# Patient Record
Sex: Female | Born: 1945 | Hispanic: No | Smoking: Never smoker
Health system: Southern US, Community
[De-identification: ages and names within clinical notes are randomized; demographics above are authoritative.]

## PROBLEM LIST (undated history)

## (undated) DIAGNOSIS — I10 Essential (primary) hypertension: Secondary | ICD-10-CM

## (undated) DIAGNOSIS — E119 Type 2 diabetes mellitus without complications: Secondary | ICD-10-CM

---

## 2009-01-14 ENCOUNTER — Encounter: Admission: RE | Admit: 2009-01-14 | Discharge: 2009-01-14 | Payer: Self-pay | Admitting: Geriatric Medicine

## 2009-01-17 ENCOUNTER — Emergency Department (HOSPITAL_COMMUNITY): Admission: EM | Admit: 2009-01-17 | Discharge: 2009-01-17 | Payer: Self-pay | Admitting: Emergency Medicine

## 2010-05-31 LAB — DIFFERENTIAL
Lymphocytes Relative: 31 % (ref 12–46)
Lymphs Abs: 1.4 10*3/uL (ref 0.7–4.0)
Neutrophils Relative %: 58 % (ref 43–77)

## 2010-05-31 LAB — CBC
MCHC: 34 g/dL (ref 30.0–36.0)
MCV: 86 fL (ref 78.0–100.0)
RBC: 4.56 MIL/uL (ref 3.87–5.11)
RDW: 13.7 % (ref 11.5–15.5)

## 2010-05-31 LAB — COMPREHENSIVE METABOLIC PANEL
AST: 17 U/L (ref 0–37)
CO2: 26 mEq/L (ref 19–32)
Calcium: 9.8 mg/dL (ref 8.4–10.5)
Creatinine, Ser: 0.42 mg/dL (ref 0.4–1.2)
GFR calc Af Amer: >60 mL/min (ref >60)
GFR calc non Af Amer: >60 mL/min (ref >60)

## 2010-05-31 LAB — LIPASE, BLOOD: Lipase: 40 U/L (ref 11–59)

## 2011-03-18 IMAGING — CT CT PELVIS W/ CM
3 of 10 series · 12 of 46 positions shown, 18 images · IV contrast (agent unspecified)
Comparison: Abdominal ultrasound 01/14/2009.

CT ABDOMEN

CLINICAL DATA: Pancreatic tail mass on ultrasound.  Worsening
abdominal pain with nausea, vomiting and diarrhea.

CT ABDOMEN WITHOUT AND WITH CONTRAST
CT PELVIS WITH CONTRAST
TECHNIQUE: Multidetector CT imaging of the abdomen was performed
initially following the standard protocol before administration of
intravenous contrast.  Multidetector CT imaging of the abdomen and
pelvis was then performed following the standard protocol during
the bolus injection of intravenous contrast.
Contrast: 100 ml [id number]-6VV intravenously.  Oral contrast was
given.

[Series 5: abd w/o 5.0 b31f st · axial · non-contrast · 0.62mm/px · z∈[-106,-50]mm · 2 of 35 slices shown]
[im 12/35  soft-tissue]
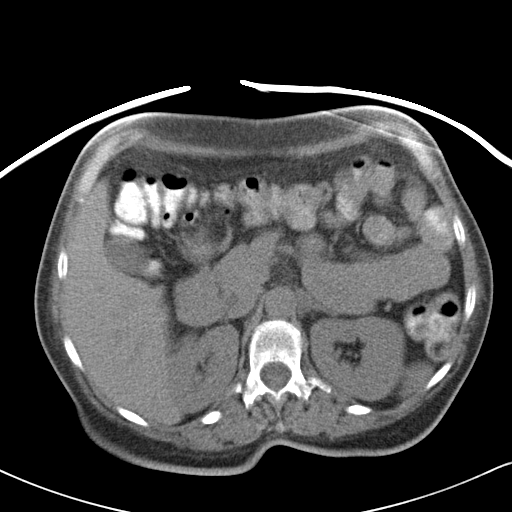
[im 23/35  soft-tissue]
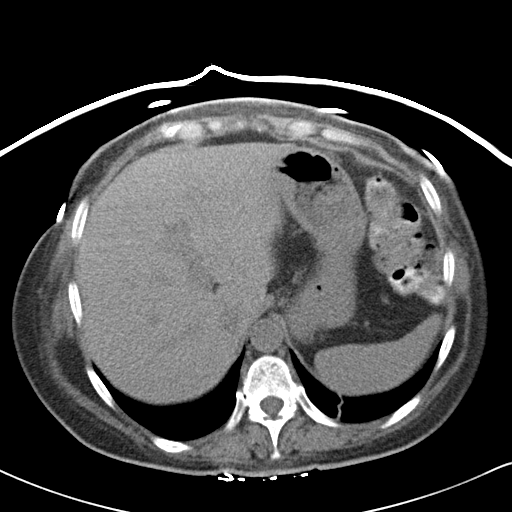

[Series 6: venous 5.0 b31f st · axial · portal-venous · 0.62mm/px · z∈[-363,-38]mm · 8 of 85 slices shown, 13 images]
[im 10/85  soft-tissue]
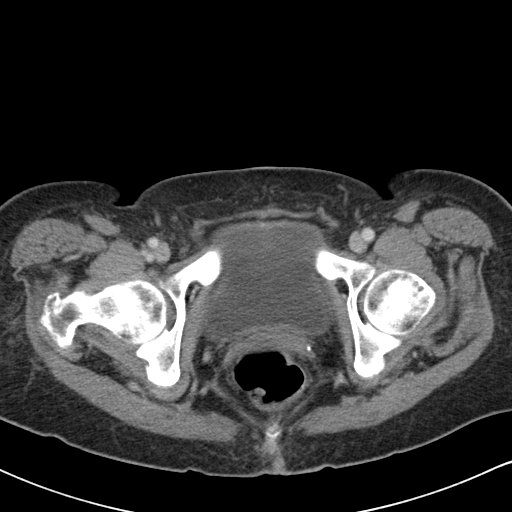
[im 10/85  bone]
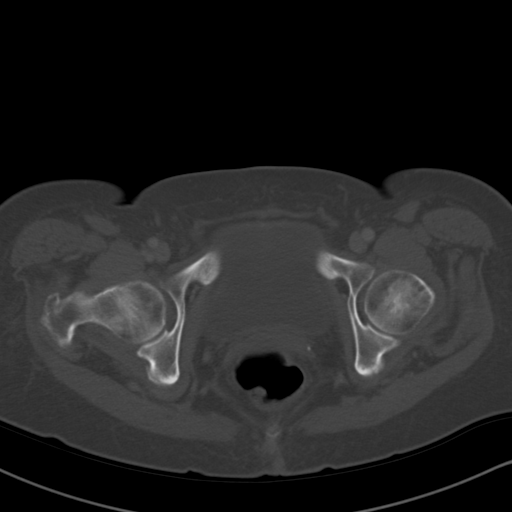
[im 19/85  soft-tissue]
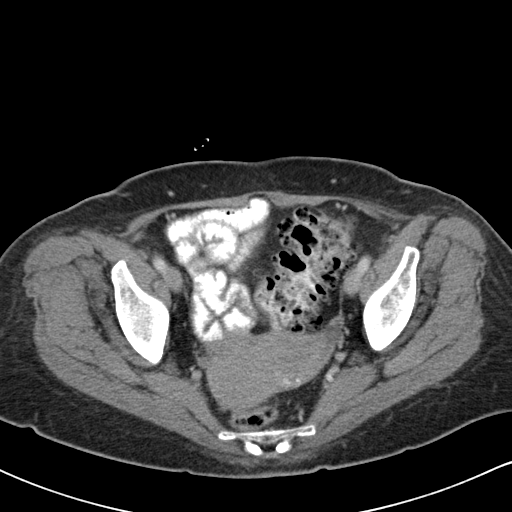
[im 29/85  soft-tissue]
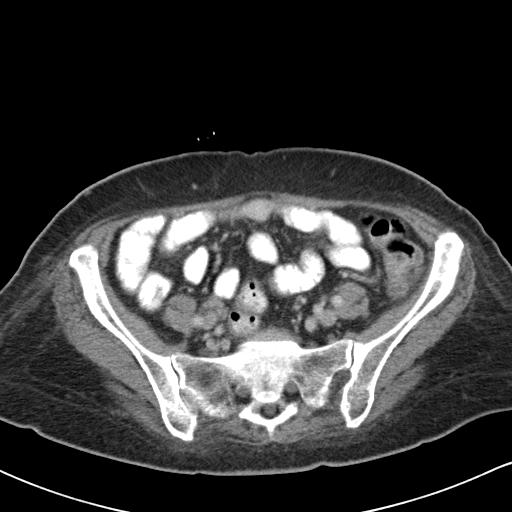
[im 38/85  soft-tissue]
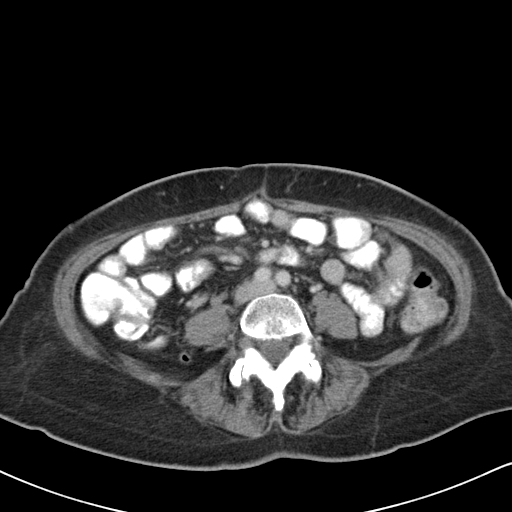
[im 47/85  soft-tissue]
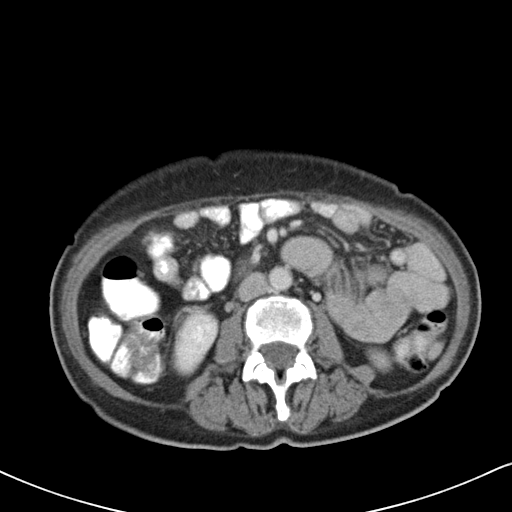
[im 47/85  lung]
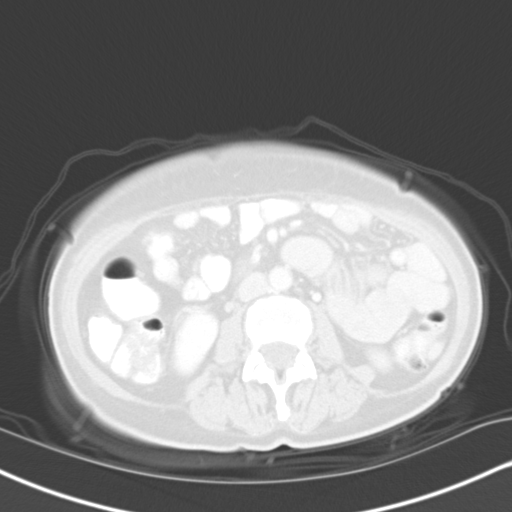
[im 57/85  soft-tissue]
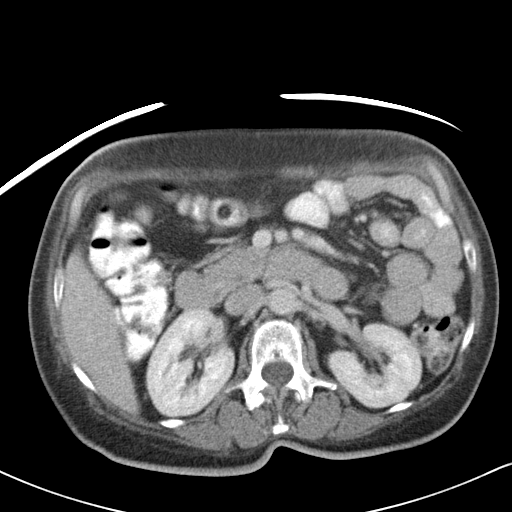
[im 57/85  lung]
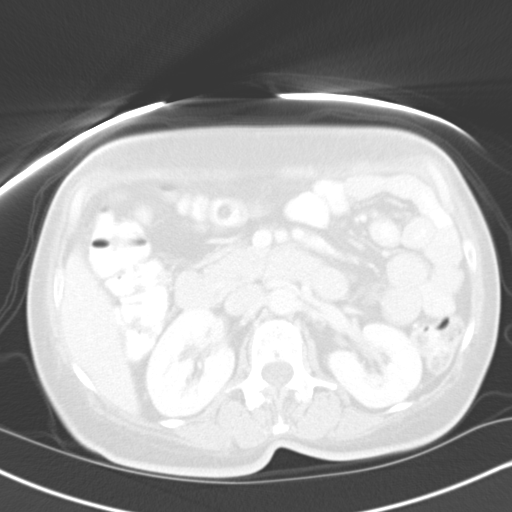
[im 66/85  soft-tissue]
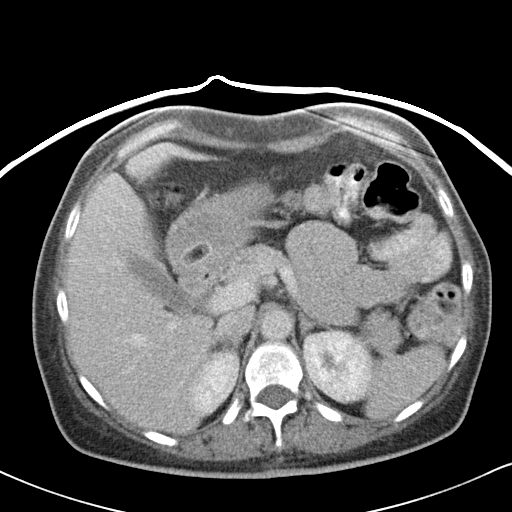
[im 66/85  lung]
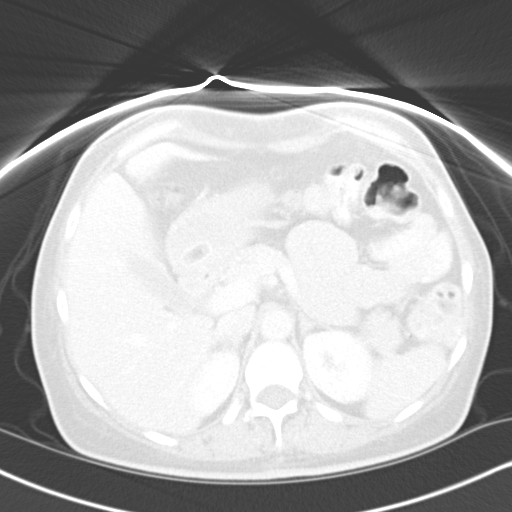
[im 75/85  soft-tissue]
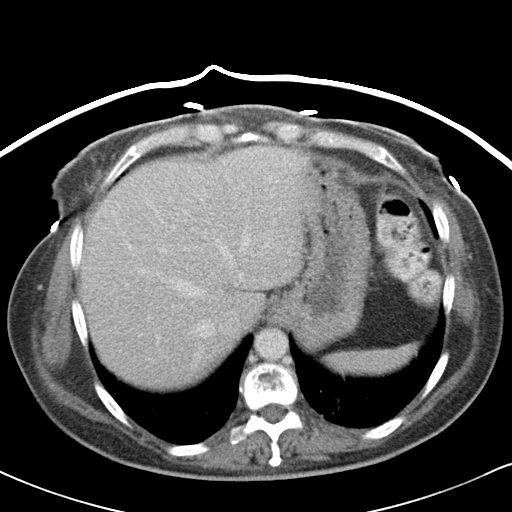
[im 75/85  lung]
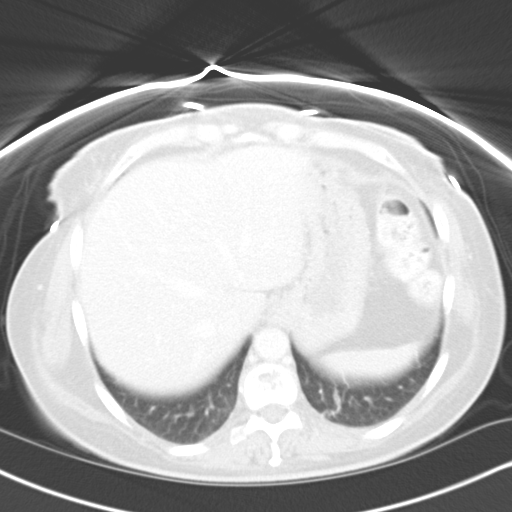

[Series 604: mpr cor · coronal · 0.83mm/px · 2 of 64 slices shown, 3 images]
[im 22/64  soft-tissue]
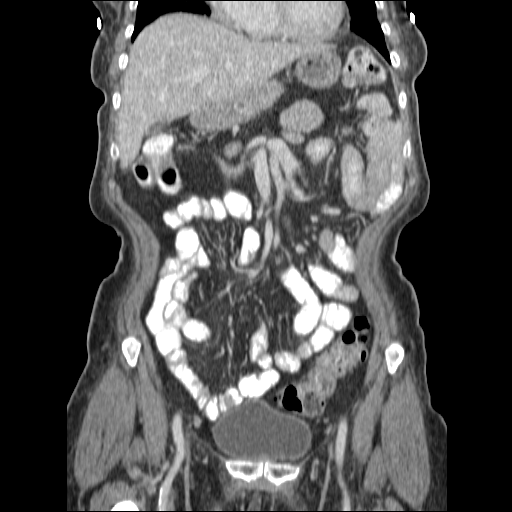
[im 22/64  bone]
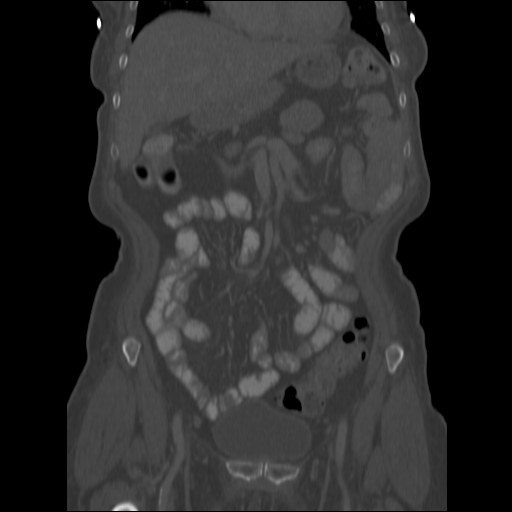
[im 43/64  soft-tissue]
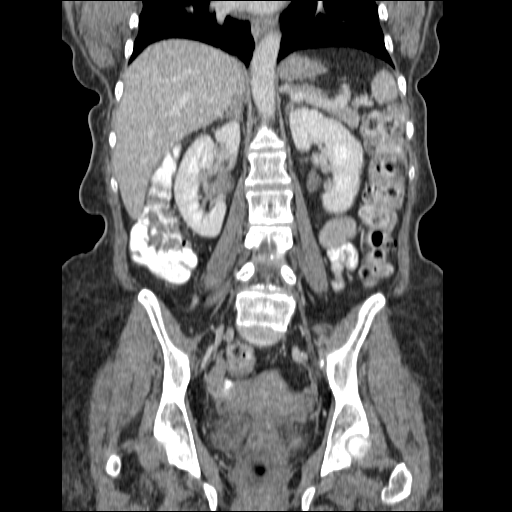

[12 of 46 positions shown; findings below may reference images not displayed]

FINDINGS: The precontrast images demonstrate no pancreatic or
urinary tract calculi.  Postcontrast, the pancreas enhances
normally.  There is no evidence of pancreatic mass, fluid
collection or inflammatory process.  The pancreatic tail appears
normal.  The splenic artery and vein appear normal.  There is no
pancreatic ductal dilatation.  There is no biliary ductal
dilatation.  The gallbladder appears normal.

There is mild atelectasis at both lung bases.  The liver, spleen,
adrenal glands and kidneys appear normal.  There are ingested pill
fragments in the stomach.  The bowel gas pattern is normal.  There
is no lymphadenopathy.  Mild facet disease is noted in the lower
lumbar spine.  The appendix appears normal.
IMPRESSION: 1.  No evidence of pancreatic mass or fluid collection.  I suspect
the ultrasound finding may have been secondary to fluid-filled
bowel.
2.  No acute abdominal findings.

CT PELVIS
FINDINGS: Sigmoid colon diverticular changes are present without
surrounding inflammation.  The uterus is globular in shape with
probable fibroid formation on the right.  There is no adnexal mass
or fluid collection.
IMPRESSION: 1.  Sigmoid diverticulosis.  No evidence of acute inflammation.
2.  Probable uterine fibroid formation.

## 2015-01-03 ENCOUNTER — Encounter: Payer: Self-pay | Admitting: Internal Medicine

## 2015-01-03 ENCOUNTER — Ambulatory Visit (INDEPENDENT_AMBULATORY_CARE_PROVIDER_SITE_OTHER): Payer: Self-pay | Admitting: Internal Medicine

## 2015-01-03 VITALS — BP 120/72 | HR 90 | Ht <= 58 in | Wt 124.0 lb

## 2015-01-03 DIAGNOSIS — E11319 Type 2 diabetes mellitus with unspecified diabetic retinopathy without macular edema: Secondary | ICD-10-CM

## 2015-01-03 DIAGNOSIS — R2681 Unsteadiness on feet: Secondary | ICD-10-CM

## 2015-01-03 DIAGNOSIS — H547 Unspecified visual loss: Secondary | ICD-10-CM

## 2015-01-03 DIAGNOSIS — E1343 Other specified diabetes mellitus with diabetic autonomic (poly)neuropathy: Secondary | ICD-10-CM

## 2015-01-03 DIAGNOSIS — I10 Essential (primary) hypertension: Secondary | ICD-10-CM

## 2015-01-03 DIAGNOSIS — Z794 Long term (current) use of insulin: Secondary | ICD-10-CM

## 2015-01-03 NOTE — Progress Notes (Signed)
   Subjective:    Patient ID: Savannah Fischer, female    DOB: May 08, 1945, 69 y.o.   MRN: [id number]  HPI   1.  Depression: Episodes of anxiety, being scared of different things happening--sounds like thinks of all the bad things that could happen--roof falling in, door falling in.  Can become tearful.  Is not sleeping as she is too afraid at night.  States she has not slept well for 2-3 months.    Son thinks she has not been able to sleep well for years, but not because she is scared.   With panic episodes, has palpitations, nausea, feeling hot and sweaty.  Can last for 10 minutes and then goes away.  Gets up and paces in house and starts to pray to calm herself.   2.  DM:  Sugars in general, have been running 130-160.They are still using 15 units of Novolin 70/30 Son was not aware he had not refilled Metformin for almost 3 months--last filled beginning of August.  Still with pills left.  Pt. Thought she was taking regularly.  Has not applied yet for orange card.  Has lived here now that she should qualify.  Have not been able to send her for vision care due to this.    3.  Hypertension:  BP has been good when checked at home, did not bring the numbers  4.  UNsteady Gait:  Likely due to problems with vision.  Has not found a 4 pronged cane.  5.  Headaches with watery eyes.  Headache in in nuchal and frontal area bilaterally.  The headache radiates down both eyes and down past maxillary area bilaterally. Not clear if this is really new.  Sounds like has had headaches in the past.  Describes a white moon shape in her right eye with headaches and black dots in her vision with left.  She associates with headache.  Has not taken any thing for headache pain.       Review of Systems     Objective:   Physical Exam  HEENT:  Not able to see discs due to clouding of cornea.   Lungs:  CTA CV:  RRR with normal S1 and S2, No S3, S4, or murmur appreciated.  Radial and DP pulses normal and equal Abd:   S, NT, No HSM or masses appreciated Toenails thickened and yellow      Assessment & Plan:  1.  Depression and likely panic attacks:  Referral for counseling before considering adding medication.  Nilda SimmerNatosha Knight, LCSW  2.  DM:  Went over medications again.  Still not getting meds appropriately and likely not as well controlled as history given. Son to pick up Metformin.  3.  Difficulty with gait:  Again, feel this is related to visual problems mainly.  Recommend 4 prong cane.  4.  Decreased Vision:  Encouraged son to bring her in for orange card sign up so we can get her referred for her vision.   5.  Poor toenail care:  Return to have trimmed   5.  Hypertension: Repeat of BP was fine at 120/72

## 2015-01-11 ENCOUNTER — Other Ambulatory Visit: Payer: Self-pay | Admitting: Licensed Clinical Social Worker

## 2015-01-14 ENCOUNTER — Ambulatory Visit (INDEPENDENT_AMBULATORY_CARE_PROVIDER_SITE_OTHER): Payer: Self-pay | Admitting: Licensed Clinical Social Worker

## 2015-01-14 DIAGNOSIS — F410 Panic disorder [episodic paroxysmal anxiety] without agoraphobia: Secondary | ICD-10-CM
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
## 2015-01-28 ENCOUNTER — Other Ambulatory Visit: Payer: Self-pay | Admitting: Licensed Clinical Social Worker

## 2015-02-14 ENCOUNTER — Telehealth: Payer: Self-pay | Admitting: Licensed Clinical Social Worker

## 2015-02-14 NOTE — Telephone Encounter (Signed)
LCSW called pt's son in order to schedule a counseling session for mother. Son had said previously that he would check his work schedule and call to make the appointment. This was the third attempt at contact.

## 2015-04-17 DIAGNOSIS — E1343 Other specified diabetes mellitus with diabetic autonomic (poly)neuropathy: Secondary | ICD-10-CM | POA: Insufficient documentation

## 2015-04-17 DIAGNOSIS — R2681 Unsteadiness on feet: Secondary | ICD-10-CM | POA: Insufficient documentation

## 2015-04-17 DIAGNOSIS — E119 Type 2 diabetes mellitus without complications: Secondary | ICD-10-CM | POA: Insufficient documentation

## 2015-04-17 DIAGNOSIS — H547 Unspecified visual loss: Secondary | ICD-10-CM | POA: Insufficient documentation

## 2015-04-17 DIAGNOSIS — I10 Essential (primary) hypertension: Secondary | ICD-10-CM | POA: Insufficient documentation

## 2015-09-12 ENCOUNTER — Telehealth: Payer: Self-pay | Admitting: Internal Medicine

## 2015-09-12 ENCOUNTER — Other Ambulatory Visit: Payer: Self-pay | Admitting: Internal Medicine

## 2015-09-12 ENCOUNTER — Other Ambulatory Visit: Payer: Self-pay

## 2015-09-12 DIAGNOSIS — E11319 Type 2 diabetes mellitus with unspecified diabetic retinopathy without macular edema: Secondary | ICD-10-CM

## 2015-09-12 DIAGNOSIS — Z794 Long term (current) use of insulin: Secondary | ICD-10-CM

## 2015-09-12 DIAGNOSIS — I10 Essential (primary) hypertension: Secondary | ICD-10-CM

## 2015-09-12 DIAGNOSIS — E1343 Other specified diabetes mellitus with diabetic autonomic (poly)neuropathy: Secondary | ICD-10-CM

## 2015-09-12 MED ORDER — METFORMIN HCL 850 MG PO TABS: 850.0000 mg | ORAL_TABLET | Freq: Two times a day (BID) | ORAL | Status: DC

## 2015-09-12 MED ORDER — LOSARTAN POTASSIUM 50 MG PO TABS
50.0000 mg | ORAL_TABLET | Freq: Every day | ORAL | Status: DC
Start: 2015-09-12 — End: 2015-09-16

## 2015-09-12 MED ORDER — INSULIN NPH ISOPHANE & REGULAR (70-30) 100 UNIT/ML ~~LOC~~ SUSP: SUBCUTANEOUS | Status: DC

## 2015-09-12 MED ORDER — METOCLOPRAMIDE HCL 10 MG PO TABS: 10.0000 mg | ORAL_TABLET | Freq: Three times a day (TID) | ORAL | Status: DC

## 2015-09-12 NOTE — Telephone Encounter (Signed)
Prescriptions sent for one month. Patient has to keep appt.

## 2015-09-12 NOTE — Telephone Encounter (Signed)
Patient would like a refill of all Rx's.  (Insulin NPH-regular Human, losartan 50 mg. Tab, metFORMIN 850 mg. Tab., metoCLOPramide 10 mg. Tab.)  Patient's son can be reached at [phone].

## 2015-09-16 ENCOUNTER — Other Ambulatory Visit: Payer: Self-pay | Admitting: Internal Medicine

## 2015-09-16 ENCOUNTER — Telehealth: Payer: Self-pay | Admitting: Internal Medicine

## 2015-09-16 DIAGNOSIS — E1343 Other specified diabetes mellitus with diabetic autonomic (poly)neuropathy: Secondary | ICD-10-CM

## 2015-09-16 DIAGNOSIS — Z794 Long term (current) use of insulin: Principal | ICD-10-CM

## 2015-09-16 DIAGNOSIS — E11319 Type 2 diabetes mellitus with unspecified diabetic retinopathy without macular edema: Secondary | ICD-10-CM

## 2015-09-16 DIAGNOSIS — I10 Essential (primary) hypertension: Secondary | ICD-10-CM

## 2015-09-16 MED ORDER — METFORMIN HCL 850 MG PO TABS: ORAL_TABLET | ORAL | Status: DC

## 2015-09-16 MED ORDER — INSULIN NPH ISOPHANE & REGULAR (70-30) 100 UNIT/ML ~~LOC~~ SUSP: SUBCUTANEOUS | Status: DC

## 2015-09-16 MED ORDER — METOCLOPRAMIDE HCL 10 MG PO TABS: ORAL_TABLET | ORAL | Status: DC

## 2015-09-16 MED ORDER — LOSARTAN POTASSIUM 50 MG PO TABS: 50.0000 mg | ORAL_TABLET | Freq: Every day | ORAL | Status: DC

## 2015-09-16 NOTE — Telephone Encounter (Signed)
Son calling and stating that [facility] [place] and her prescriptions are not available, the pharmacy did not receive.  It appears her prescriptions for one month were filled on the 17th and 18th.   Will send again.   Discussed she needs to keep appt. As she was supposed to be seen again in February

## 2015-09-19 ENCOUNTER — Other Ambulatory Visit: Payer: Self-pay

## 2015-09-19 NOTE — Telephone Encounter (Signed)
Sent in one refill for Losartan 50mg  by fax today to ensure the patient keeps the August appointment with Dr. Delrae Alfred. Spoke to patient's son, Kern Alberta, to inform him of this. He verbalized understanding and stated she would be at her next appointment.

## 2015-10-03 ENCOUNTER — Ambulatory Visit (INDEPENDENT_AMBULATORY_CARE_PROVIDER_SITE_OTHER): Payer: Self-pay | Admitting: Internal Medicine

## 2015-10-03 ENCOUNTER — Encounter: Payer: Self-pay | Admitting: Internal Medicine

## 2015-10-03 VITALS — BP 128/80 | HR 72 | Resp 15 | Ht <= 58 in | Wt 114.0 lb

## 2015-10-03 DIAGNOSIS — Z23 Encounter for immunization: Secondary | ICD-10-CM

## 2015-10-03 DIAGNOSIS — I10 Essential (primary) hypertension: Secondary | ICD-10-CM

## 2015-10-03 DIAGNOSIS — Z79899 Other long term (current) drug therapy: Secondary | ICD-10-CM

## 2015-10-03 DIAGNOSIS — H547 Unspecified visual loss: Secondary | ICD-10-CM

## 2015-10-03 DIAGNOSIS — Z794 Long term (current) use of insulin: Secondary | ICD-10-CM

## 2015-10-03 DIAGNOSIS — R2681 Unsteadiness on feet: Secondary | ICD-10-CM

## 2015-10-03 DIAGNOSIS — G44229 Chronic tension-type headache, not intractable: Secondary | ICD-10-CM

## 2015-10-03 DIAGNOSIS — E11319 Type 2 diabetes mellitus with unspecified diabetic retinopathy without macular edema: Secondary | ICD-10-CM

## 2015-10-03 LAB — GLUCOSE, POCT (MANUAL RESULT ENTRY): POC GLUCOSE: 83 mg/dL (ref 70–99)

## 2015-10-03 NOTE — Progress Notes (Signed)
Subjective:    Patient ID: Savannah Fischer, female    DOB: 1945/05/28, 70 y.o.   MRN: [id number]  HPI   1.  Essential Hypertension:  Taking  Losartan regularly without problems.    2.  Poor vision:  Still has not been signed up on orange card, so difficulty affording referral to eye doctor.  Feel her poor vision affects her ability to ambulate in stabile fashion.  3.  DM:  Has moved in with a different son for past 8 days and they are working on diet and increased physical activity.  Not clear how permanent this arrangement is.   Still no eye exam as above. Only using insulin in the morning and not in the evening. Taking 1/2 of 850 mg Metformin twice daily. Stopped the Metoclopramide.  Not clear why.  Food not moving through her and is constipated.  [facility].  [place] in [place].   Would like a diabetic educator   4.  Panic Attacks:  Seems to no longer be a problem.   Feels her talk with Savannah Hitchatosha was very helpful.  Is very relaxed with her other son, Savannah AlbertaSergio.  5.  Headaches:  Chronic issue.  Nuchal and frontal area. Feels like her head is being squeezed from outside.    Current Meds  Medication Sig  . b complex vitamins capsule Take 1 capsule by mouth daily.  Marland Kitchen. CALCIUM-PHOSPHORUS-VITAMIN D PO Take 2 capsules by mouth daily.  . folic acid (FOLVITE) 800 MCG tablet Take 800 mcg by mouth daily.  . insulin NPH-regular Human (NOVOLIN 70/30) (70-30) 100 UNIT/ML injection Inject 15 units subcutaneous before morning meal and 3 units subcutaneously 30 minutes before evening meal (Patient taking differently: Inject 16 Units into the skin daily. Inject 15 units subcutaneous before morning meal and 3 units subcutaneously 30 minutes before evening meal)  . losartan (COZAAR) 50 MG tablet Take 1 tablet (50 mg total) by mouth daily.  . metFORMIN (GLUCOPHAGE) 850 MG tablet 1/2 tab by mouth twice daily with meals  . [DISCONTINUED] losartan (COZAAR) 50 MG  tablet Take 1 tablet (50 mg total) by mouth daily.    No Known Allergies  Review of Systems     Objective:   Physical Exam NAD HEENT:  EOMI, Obvious difficulties with vision, TMs pearly gray, throat without injection Neck:  Supple, though tender over bilateral traps and along cervical paraspinous musculature to nuchal ridge Lungs:  CTA CV:  RRR without murmur or rub, radial and DP pulses normal and equal LE:  NO edema Neuro:  A & Ox3, CN II-XII grossly intact save for vision. Motor 5/5.  Gait is unsteady, but this seems more related to visual difficulties.  Motor 5/5, DTRs 2+/4 throughout.       Assessment & Plan:  1.  Essential Hypertension:  Controlled with Losartan. CMP today  2.  DM Type 2:  Urged her son to get her signed up on orange card so we can get her less expensive insulin and strips for following her sugars.   Her son is very interested in supporting better eating.  Discussed at length diet and physical activity. Check A1C before deciding where she should be with her insulin. Encouraged returning to low dose Metoclopramide if difficulties with digestion.  3.  Panic Attacks:  Seems to be improved.  This son seems to be supportive, so hopefully a calmer supportive environment will continue to be helpful.  4.  Tension Headaches:  Neck stretches, massage, again perhaps more supportive environment will help with this.  5.  HM:  Prevnar 13 today as well as Tdap.  6.  Poor vision:  Needs orange card for referral.

## 2015-10-03 NOTE — Patient Instructions (Signed)

## 2015-10-04 LAB — COMPREHENSIVE METABOLIC PANEL
A/G RATIO: 1.8 (ref 1.2–2.2)
ALT: 13 IU/L (ref 0–32)
AST: 19 IU/L (ref 0–40)
Albumin: 4.4 g/dL (ref 3.5–4.8)
Alkaline Phosphatase: 59 IU/L (ref 39–117)
BILIRUBIN TOTAL: 0.3 mg/dL (ref 0.0–1.2)
BUN/Creatinine Ratio: 45 — ABNORMAL HIGH (ref 12–28)
BUN: 30 mg/dL — AB (ref 8–27)
CHLORIDE: 105 mmol/L (ref 96–106)
CO2: 23 mmol/L (ref 18–29)
Calcium: 9.6 mg/dL (ref 8.7–10.3)
Creatinine, Ser: 0.67 mg/dL (ref 0.57–1.00)
GFR calc non Af Amer: 88 mL/min/{1.73_m2} (ref >59)
GFR, EST AFRICAN AMERICAN: 102 mL/min/{1.73_m2} (ref >59)
GLUCOSE: 45 mg/dL — AB (ref 65–99)
Globulin, Total: 2.5 g/dL (ref 1.5–4.5)
Potassium: 4.2 mmol/L (ref 3.5–5.2)
Sodium: 143 mmol/L (ref 134–144)
TOTAL PROTEIN: 6.9 g/dL (ref 6.0–8.5)

## 2015-10-04 LAB — HGB A1C W/O EAG: Hgb A1c MFr Bld: 6.4 % — ABNORMAL HIGH (ref 4.8–5.6)

## 2015-10-05 ENCOUNTER — Other Ambulatory Visit: Payer: Self-pay | Admitting: Internal Medicine

## 2015-10-05 DIAGNOSIS — I10 Essential (primary) hypertension: Secondary | ICD-10-CM

## 2015-10-05 MED ORDER — LOSARTAN POTASSIUM 50 MG PO TABS: 50.0000 mg | ORAL_TABLET | Freq: Every day | ORAL | 11 refills | Status: DC

## 2015-12-06 DIAGNOSIS — G44229 Chronic tension-type headache, not intractable: Secondary | ICD-10-CM | POA: Insufficient documentation

## 2016-01-02 ENCOUNTER — Ambulatory Visit (INDEPENDENT_AMBULATORY_CARE_PROVIDER_SITE_OTHER): Payer: Self-pay | Admitting: Internal Medicine

## 2016-01-02 DIAGNOSIS — E11319 Type 2 diabetes mellitus with unspecified diabetic retinopathy without macular edema: Secondary | ICD-10-CM

## 2016-01-02 DIAGNOSIS — I10 Essential (primary) hypertension: Secondary | ICD-10-CM

## 2016-01-02 DIAGNOSIS — Z794 Long term (current) use of insulin: Secondary | ICD-10-CM

## 2016-01-02 DIAGNOSIS — E1343 Other specified diabetes mellitus with diabetic autonomic (poly)neuropathy: Secondary | ICD-10-CM

## 2016-01-02 MED ORDER — METOCLOPRAMIDE HCL 10 MG PO TABS: ORAL_TABLET | ORAL | 11 refills | Status: DC

## 2016-01-02 MED ORDER — METFORMIN HCL 850 MG PO TABS: ORAL_TABLET | ORAL | 11 refills | Status: DC

## 2016-01-02 NOTE — Patient Instructions (Signed)
Please get a flu vaccine and pneumovax 23 v next August

## 2016-01-02 NOTE — Progress Notes (Signed)
   Subjective:    Patient ID: Savannah Fischer, female    DOB: April 23, 1945, 70 y.o.   MRN: [id number]  HPI   Not clear what is going on. Patient is going back and forth between two of her sons.   Now her son states she wants to leave and go back to [place].  She actually states strongly that she is content here and that he wants her to leave. Her son states he has not signed her up for the orange card as she will be leaving in 2 months.   She has two other children in [place], but her son states he will take her back and buy her a home and put someone in place to care for her. Discussed with her orange card she could get her vision evaluated.  He states this was done in [place] and she was too late to have anything done--reportedly due to glaucoma and diabetic retinopathy.   1.  DM:  Sugars in the morning is 105 max after she eats.  Her sugars 1 hour pp in the afternoon are running in the 90s.  She is using 15 units of Novolin 70/30 in the morning and 3 units in the afternoon. A1C in August was 6.4%  2. Essential Hypertension:  Taking Losartan regularly.  3.  Diabetic Gastroparesis: Not taking Metoclopramide 5 mg three times daily before meals.  When she takes, takes 10 mg at 3 p.m.    Current Meds  Medication Sig  . b complex vitamins capsule Take 1 capsule by mouth daily.  Marland Kitchen. CALCIUM-PHOSPHORUS-VITAMIN D PO Take 2 capsules by mouth daily.  . folic acid (FOLVITE) 800 MCG tablet Take 800 mcg by mouth daily.  . insulin NPH-regular Human (NOVOLIN 70/30) (70-30) 100 UNIT/ML injection Inject 15 units subcutaneous before morning meal and 3 units subcutaneously 30 minutes before evening meal  . losartan (COZAAR) 50 MG tablet Take 1 tablet (50 mg total) by mouth daily.  . metFORMIN (GLUCOPHAGE) 850 MG tablet 1/2 tab by mouth twice daily with meals  . metoCLOPramide (REGLAN) 10 MG tablet 1/2 tab three times daily before meals    Review of Systems     Objective:   Physical Exam Difficult  seeing--no change Lungs:  CTA CV:  RRR without murmur or rub, radial pulses normal and equal Abd:  S, NT, No HSM or mass, + BS       Assessment & Plan:  1.  DM:  Discussed a decrease of  Novolin 70/30 to 14 units in the morning with her tight control as concerned she may start having significantly low blood sugars..  Discussed in [place], if more active, to decrease insulin more. Not clear if she really wants to leave.  Sounds like family turmoil follows her as she spends time with each of her children. Discussed she should get an influenza vaccine.  We are out currently.  May call back in 2 weeks to see if more in or get one at another venue.  2.  Essential Hypertension:  Recheck of bp was better at 140/68  3.  Diabetic Gastroparesis: encouraged to take her Metoclopramide before a meal daily.  She prefers to take at 3 p.m. And only once daily.    4.  Social issues:  Sounds like her family has determined she should return to [place] and she disagrees.

## 2016-02-05 ENCOUNTER — Telehealth: Payer: Self-pay | Admitting: Internal Medicine

## 2016-02-05 DIAGNOSIS — Z794 Long term (current) use of insulin: Principal | ICD-10-CM

## 2016-02-05 DIAGNOSIS — E11319 Type 2 diabetes mellitus with unspecified diabetic retinopathy without macular edema: Secondary | ICD-10-CM

## 2016-02-05 NOTE — Telephone Encounter (Signed)
[facility] [address]  They should be able to transfer.

## 2016-02-05 NOTE — Telephone Encounter (Signed)
This was sent to their facility in November for a year's worth of refills.  Please have them check to make sure they don't already have.

## 2016-02-06 ENCOUNTER — Other Ambulatory Visit: Payer: Self-pay

## 2016-02-06 DIAGNOSIS — Z794 Long term (current) use of insulin: Principal | ICD-10-CM

## 2016-02-06 DIAGNOSIS — E11319 Type 2 diabetes mellitus with unspecified diabetic retinopathy without macular edema: Secondary | ICD-10-CM

## 2016-02-08 NOTE — Telephone Encounter (Signed)
Spoke with pharmacy and Rx was received. Rx was sent in error.

## 2016-02-13 ENCOUNTER — Other Ambulatory Visit: Payer: Self-pay

## 2016-02-13 DIAGNOSIS — E11319 Type 2 diabetes mellitus with unspecified diabetic retinopathy without macular edema: Secondary | ICD-10-CM

## 2016-02-13 DIAGNOSIS — E1343 Other specified diabetes mellitus with diabetic autonomic (poly)neuropathy: Secondary | ICD-10-CM

## 2016-02-13 DIAGNOSIS — Z794 Long term (current) use of insulin: Principal | ICD-10-CM

## 2016-02-13 MED ORDER — METOCLOPRAMIDE HCL 10 MG PO TABS: ORAL_TABLET | ORAL | 11 refills | Status: AC

## 2016-02-13 MED ORDER — METFORMIN HCL 850 MG PO TABS: ORAL_TABLET | ORAL | 11 refills | Status: DC

## 2016-02-13 NOTE — Telephone Encounter (Signed)
Rx faxed to [facility] [place]

## 2016-02-13 NOTE — Telephone Encounter (Signed)
Patient called to have medications metoCLOPramide (REGLAN) 10 MG tablet  and metFORMIN (GLUCOPHAGE) 850 MG tablet sent to the pharmacy below [facility] [address] - [place], [place]  [phone] (Phone) [phone] (Fax)

## 2016-02-29 ENCOUNTER — Encounter: Payer: Self-pay | Admitting: Internal Medicine

## 2016-02-29 MED ORDER — INSULIN NPH ISOPHANE & REGULAR (70-30) 100 UNIT/ML ~~LOC~~ SUSP: SUBCUTANEOUS | 0 refills | Status: AC

## 2016-03-01 ENCOUNTER — Telehealth: Payer: Self-pay | Admitting: Licensed Clinical Social Worker

## 2016-03-01 NOTE — Telephone Encounter (Signed)
LCSW called pt to check in regarding health and family needs. Left voicemail.

## 2016-10-26 ENCOUNTER — Encounter: Payer: Self-pay | Admitting: Internal Medicine

## 2016-10-26 ENCOUNTER — Ambulatory Visit (INDEPENDENT_AMBULATORY_CARE_PROVIDER_SITE_OTHER): Payer: Self-pay | Admitting: Internal Medicine

## 2016-10-26 VITALS — BP 170/90 | HR 70 | Resp 12 | Ht <= 58 in | Wt 117.0 lb

## 2016-10-26 DIAGNOSIS — G44229 Chronic tension-type headache, not intractable: Secondary | ICD-10-CM

## 2016-10-26 DIAGNOSIS — Z794 Long term (current) use of insulin: Secondary | ICD-10-CM

## 2016-10-26 DIAGNOSIS — Z79899 Other long term (current) drug therapy: Secondary | ICD-10-CM

## 2016-10-26 DIAGNOSIS — I10 Essential (primary) hypertension: Secondary | ICD-10-CM

## 2016-10-26 DIAGNOSIS — E11319 Type 2 diabetes mellitus with unspecified diabetic retinopathy without macular edema: Secondary | ICD-10-CM

## 2016-10-26 NOTE — Progress Notes (Signed)
   Subjective:    Patient ID: Savannah Fischer, female    DOB: 1945/08/16, 71 y.o.   MRN: [id number]  HPI   Though she was going back to [place], but sounds like moving around between sons.   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  Just started the Meloxicam yesterday.  Would like to be seen for headaches instead.   Unable to characterize the pain. Having headaches originating from nuchal area to frontal area.  States this has been going on for more than 1 month Denies neck pain May have increased stress.  The pain is constant and perhaps worse when in bed.  Does have a good pillow.   Sometimes has associated nausea when headache is more severe.   No photophobia or phonophobia.    Did not have any bloodwork done at [facility] 2 days ago.  Essential Hypertension:  Losartan 50 mg daily.  Son states she has not missed.  Looking at her pill bottles, however, support she is missing her meds frequently.  Son states she is checking sugars regularly and has been under 120.  Has history of diabetes. Continues to use 14/3 units of Novolin 70/30 morning and lower dose in evening.  Current Meds  Medication Sig  . b complex vitamins capsule Take by mouth.  Marland Kitchen. CALCIUM-PHOSPHORUS-VITAMIN D PO Take 2 capsules by mouth daily.  . folic acid (FOLVITE) 800 MCG tablet Take 800 mcg by mouth daily.  . insulin NPH-regular Human (NOVOLIN 70/30) (70-30) 100 UNIT/ML injection Inject 14 units subcutaneous before morning meal and 3 units subcutaneously 30 minutes before evening meal  . losartan (COZAAR) 50 MG tablet Take 1 tablet (50 mg total) by mouth daily.  . meloxicam (MOBIC) 15 MG tablet Take by mouth.  . metFORMIN (GLUCOPHAGE) 850 MG tablet 1/2 tab by mouth twice daily with meals  . [DISCONTINUED] insulin NPH-regular Human (NOVOLIN 70/30) (70-30) 100 UNIT/ML injection Inject 12.5 units subcutaneous before morning meal.  . [DISCONTINUED]  losartan (COZAAR) 50 MG tablet Take 1 tablet (50 mg total) by mouth daily.  . [DISCONTINUED] losartan (COZAAR) 50 MG tablet Take by mouth.    No Known Allergies    Review of Systems     Objective:   Physical Exam  NAD HEENT:  PERRL, EOMI, discs sharp, TMs pearly gray, throat without injection. Neck:  Supple, No adenopathy.  Tender over traps bilaterally and over cervical spinous musculature to nuchal ridge. Chest: CTA CV:  RRR without murmur or rub, radial and DP pulses normal and equal. Abd:  S, NT, No HSM or mass. Neuro:  CN  II-XII grossly intact, DTRs 2+/4 throughout, Motor 5/5 throughout, sensory grossly normal        Assessment & Plan:  1.  Muscle Tension HA: Taking Meloxicam.  Encouraged good pillow. Ref[facility]. Find a [facility] church with spanish speakers--needs something outside of the home to decrease loneliness and stress.  2.  DM:  A1C  3.  Essential hypertension:  Missing medication again.  Meds refilled. Suspect also up due to pain.  Follow up in 3 months.

## 2016-10-27 LAB — COMPREHENSIVE METABOLIC PANEL
ALK PHOS: 67 IU/L (ref 39–117)
ALT: 9 IU/L (ref 0–32)
AST: 15 IU/L (ref 0–40)
Albumin/Globulin Ratio: 1.8 (ref 1.2–2.2)
Albumin: 4.1 g/dL (ref 3.5–4.8)
BUN/Creatinine Ratio: 45 — ABNORMAL HIGH (ref 12–28)
BUN: 31 mg/dL — AB (ref 8–27)
Bilirubin Total: 0.2 mg/dL (ref 0.0–1.2)
CALCIUM: 9.2 mg/dL (ref 8.7–10.3)
CO2: 24 mmol/L (ref 20–29)
CREATININE: 0.69 mg/dL (ref 0.57–1.00)
Chloride: 101 mmol/L (ref 96–106)
GFR calc Af Amer: 100 mL/min/{1.73_m2} (ref >59)
GFR, EST NON AFRICAN AMERICAN: 87 mL/min/{1.73_m2} (ref >59)
GLUCOSE: 58 mg/dL — AB (ref 65–99)
Globulin, Total: 2.3 g/dL (ref 1.5–4.5)
Potassium: 4.7 mmol/L (ref 3.5–5.2)
Sodium: 138 mmol/L (ref 134–144)
Total Protein: 6.4 g/dL (ref 6.0–8.5)

## 2016-10-27 LAB — CBC WITH DIFFERENTIAL/PLATELET
BASOS ABS: 0.1 10*3/uL (ref 0.0–0.2)
BASOS: 1 %
EOS (ABSOLUTE): 0.1 10*3/uL (ref 0.0–0.4)
Eos: 3 %
HEMOGLOBIN: 11.1 g/dL (ref 11.1–15.9)
Hematocrit: 34.1 % (ref 34.0–46.6)
IMMATURE GRANS (ABS): 0 10*3/uL (ref 0.0–0.1)
IMMATURE GRANULOCYTES: 0 %
LYMPHS: 38 %
Lymphocytes Absolute: 1.6 10*3/uL (ref 0.7–3.1)
MCH: 27.3 pg (ref 26.6–33.0)
MCHC: 32.6 g/dL (ref 31.5–35.7)
MCV: 84 fL (ref 79–97)
MONOCYTES: 9 %
Monocytes Absolute: 0.4 10*3/uL (ref 0.1–0.9)
NEUTROS ABS: 2.1 10*3/uL (ref 1.4–7.0)
NEUTROS PCT: 49 %
PLATELETS: 237 10*3/uL (ref 150–379)
RBC: 4.07 x10E6/uL (ref 3.77–5.28)
RDW: 14.1 % (ref 12.3–15.4)
WBC: 4.3 10*3/uL (ref 3.4–10.8)

## 2016-10-27 LAB — HGB A1C W/O EAG: HEMOGLOBIN A1C: 6.2 % — AB (ref 4.8–5.6)

## 2016-10-30 MED ORDER — LOSARTAN POTASSIUM 50 MG PO TABS: 50.0000 mg | ORAL_TABLET | Freq: Every day | ORAL | 11 refills | Status: AC

## 2016-11-27 ENCOUNTER — Other Ambulatory Visit: Payer: Self-pay

## 2016-12-22 ENCOUNTER — Encounter: Payer: Self-pay | Admitting: Internal Medicine

## 2017-01-25 ENCOUNTER — Ambulatory Visit: Payer: Self-pay | Admitting: Internal Medicine

## 2017-02-25 ENCOUNTER — Other Ambulatory Visit: Payer: Self-pay

## 2017-02-25 ENCOUNTER — Telehealth: Payer: Self-pay | Admitting: Internal Medicine

## 2017-02-25 DIAGNOSIS — E11319 Type 2 diabetes mellitus with unspecified diabetic retinopathy without macular edema: Secondary | ICD-10-CM

## 2017-02-25 DIAGNOSIS — Z794 Long term (current) use of insulin: Principal | ICD-10-CM

## 2017-02-25 MED ORDER — METFORMIN HCL 850 MG PO TABS: ORAL_TABLET | ORAL | 11 refills | Status: AC

## 2017-02-25 NOTE — Telephone Encounter (Signed)
Patient needs a Rx on metFORMIN (GLUCOPHAGE) 850 MG tablet. Please advise.

## 2017-02-25 NOTE — Telephone Encounter (Signed)
Rx sent to pharmacy   

## 2017-03-15 ENCOUNTER — Ambulatory Visit: Payer: Self-pay | Admitting: Internal Medicine

## 2017-03-15 ENCOUNTER — Encounter: Payer: Self-pay | Admitting: Internal Medicine

## 2017-03-15 VITALS — BP 130/90 | HR 80 | Resp 12 | Ht <= 58 in | Wt 116.0 lb

## 2017-03-15 DIAGNOSIS — I10 Essential (primary) hypertension: Secondary | ICD-10-CM

## 2017-03-15 DIAGNOSIS — Z794 Long term (current) use of insulin: Secondary | ICD-10-CM

## 2017-03-15 DIAGNOSIS — Z1239 Encounter for other screening for malignant neoplasm of breast: Secondary | ICD-10-CM

## 2017-03-15 DIAGNOSIS — Z1231 Encounter for screening mammogram for malignant neoplasm of breast: Secondary | ICD-10-CM

## 2017-03-15 DIAGNOSIS — Z23 Encounter for immunization: Secondary | ICD-10-CM

## 2017-03-15 DIAGNOSIS — E11319 Type 2 diabetes mellitus with unspecified diabetic retinopathy without macular edema: Secondary | ICD-10-CM

## 2017-03-15 DIAGNOSIS — G44229 Chronic tension-type headache, not intractable: Secondary | ICD-10-CM

## 2017-03-15 LAB — GLUCOSE, POCT (MANUAL RESULT ENTRY): POC Glucose: 139 mg/dl — AB (ref 70–99)

## 2017-03-15 NOTE — Patient Instructions (Addendum)
[facility] Eli[facility] at [facility] Phone:  [phone] Email:  [email or web address] [employer]:  Adult nurse[facility]ior Resource Center:  Application process:  [phone]

## 2017-03-15 NOTE — Progress Notes (Signed)
Subjective:    Patient ID: Savannah Fischer, female    DOB: 10/16/1945, 72 y.o.   MRN: [id number]  HPI   Accompanied by daughter in law, who is able to give more history to Miriya's family support and history.  Not wanting to do anything.    1.  Depression: Lived in [place] years with her son and then went back to [place] in 2009 to see her brothers and her mother.   Her son went back to [place] to bring her back here in 2016 and she established here soon after. She was living with her daughter in [place] at one point, but left that home and moved into a boarding house for 2 months prior to her son coming to get her and [place]   Was not getting along with her daughter at the time. Did like living in the community and was active in the [employer]. Feels very isolated.  2.  Headaches:  The same.  See last visit.  3.  DM:  Sugars are fine at home.  A1C in August quite good. Feet are very well cared for by daughter in law.  Current Meds  Medication Sig  . b complex vitamins capsule Take by mouth.  Marland Kitchen. CALCIUM-PHOSPHORUS-VITAMIN D PO Take 2 capsules by mouth daily.  . folic acid (FOLVITE) 800 MCG tablet Take 800 mcg by mouth daily.  . insulin NPH-regular Human (NOVOLIN 70/30) (70-30) 100 UNIT/ML injection Inject 14 units subcutaneous before morning meal and 3 units subcutaneously 30 minutes before evening meal  . losartan (COZAAR) 50 MG tablet Take 1 tablet (50 mg total) by mouth daily.  . metFORMIN (GLUCOPHAGE) 850 MG tablet 1/2 tab by mouth twice daily with meals    No Known Allergies      Review of Systems     Objective:   Physical Exam   NAD HEENT:  Requires arm to get on exam table due to poor vision.  EOMI, TMs pearly gray, throat without injection, MMM Neck:  Supple, though tender over traps and cervical paraspinous musculature to nuchal ridge Chest:  CTA CV:  RRR without murmur or rub, radial pulses normal and equal    Diabetic Foot Exam  - Simple   Simple Foot Form Diabetic Foot exam was performed with the following findings:  Yes 03/15/2017  5:16 PM  Visual Inspection See comments:  Yes Sensation Testing See comments:  Yes Pulse Check Posterior Tibialis and Dorsalis pulse intact bilaterally:  Yes Comments No sensation of 10 g monofilament along plantar tarsal area of left foot and along left lateral plantar foot.  She does sense all of right foot.   Bunions bilaterally Discoloration of right great toenail with thickening, but nails are otherwise well manicured.        Assessment & Plan:  1.  Isolation and depression;  [employer] for learning how to use cane. Daughter in law given contact information for [facility] Elijah Birkom [facility]. [employer] where Spanish speaking groups available and services as well. Also contact information for [employer] for transportation.  2.  Muscle tension headache:  [place] [facility] [place]--see of OT for cane usage there as well.  3.  DM:  Has been well controlled.  Not clear if had influenza vaccine this fall.  Daughter in law will check husband. Pneumococcal 23 v vaccine as well.   Had prevnar 13 2 years ago  4.  Essential hypertension:  Better control.  Not clear if missing Losartan any longer.

## 2017-04-19 ENCOUNTER — Ambulatory Visit
Admission: RE | Admit: 2017-04-19 | Discharge: 2017-04-19 | Disposition: A | Discharge status: Home / Self Care | Payer: No Typology Code available for payment source | Source: Ambulatory Visit | Attending: Internal Medicine | Admitting: Internal Medicine

## 2017-04-19 DIAGNOSIS — Z1239 Encounter for other screening for malignant neoplasm of breast: Secondary | ICD-10-CM

## 2017-04-23 ENCOUNTER — Other Ambulatory Visit (HOSPITAL_COMMUNITY): Payer: Self-pay | Admitting: *Deleted

## 2017-04-23 DIAGNOSIS — R928 Other abnormal and inconclusive findings on diagnostic imaging of breast: Secondary | ICD-10-CM

## 2017-05-04 ENCOUNTER — Encounter: Payer: Self-pay | Admitting: Internal Medicine

## 2017-05-09 ENCOUNTER — Ambulatory Visit (HOSPITAL_COMMUNITY)
Admission: RE | Admit: 2017-05-09 | Discharge: 2017-05-09 | Disposition: A | Discharge status: Home / Self Care | Payer: Self-pay | Source: Ambulatory Visit | Attending: Obstetrics and Gynecology | Admitting: Obstetrics and Gynecology

## 2017-05-09 ENCOUNTER — Other Ambulatory Visit (HOSPITAL_COMMUNITY): Payer: Self-pay | Admitting: Obstetrics and Gynecology

## 2017-05-09 ENCOUNTER — Encounter (HOSPITAL_COMMUNITY): Payer: Self-pay

## 2017-05-09 ENCOUNTER — Ambulatory Visit
Admission: RE | Admit: 2017-05-09 | Discharge: 2017-05-09 | Disposition: A | Discharge status: Home / Self Care | Payer: No Typology Code available for payment source | Source: Ambulatory Visit | Attending: Obstetrics and Gynecology | Admitting: Obstetrics and Gynecology

## 2017-05-09 DIAGNOSIS — R928 Other abnormal and inconclusive findings on diagnostic imaging of breast: Secondary | ICD-10-CM

## 2017-05-09 DIAGNOSIS — Z01419 Encounter for gynecological examination (general) (routine) without abnormal findings: Secondary | ICD-10-CM

## 2017-05-09 HISTORY — DX: Type 2 diabetes mellitus without complications: E11.9

## 2017-05-09 HISTORY — DX: Essential (primary) hypertension: I10

## 2017-05-09 NOTE — Patient Instructions (Signed)
Explained breast self awareness with Savannah Fischer Seevers. Let patient know [facility] will cover Pap smears and HPV typing every 5 years unless has a history of abnormal Pap smears. [facility] breast diagnostic mamm[facility] and possible left breast ultrasound per recommendation. Appointment scheduled for Thursday, May 09, 2017 at 0850. Let patient know will follow up with her within the next couple weeks with results of Pap smear by letter or phone. Savannah Fischer Mazer verbalized understanding.  [place], Kathaleen Maserhristine Poll, RN 12:21 PM

## 2017-05-09 NOTE — Progress Notes (Signed)
[facility] due to recommending additional imaging of the left breast. Screening mammogram completed 04/19/2017.  Pap Smear: Pap smear completed today. Last Pap smear was 11 years ago and normal per patient. Per patient has no history of an abnormal Pap smear. No Pap smear results are in Epic.  Physical exam: Breasts Breasts symmetrical. No skin abnormalities bilateral breasts. No nipple retraction bilateral breasts. No nipple discharge bilateral breasts. No lymphadenopathy. No lumps palpated bilateral breasts. No complaints of pain or tenderness on exam. [facility] for a left breast diagnostic mammogram and possible left breast ultrasound per recommendation. Appointment scheduled for Thursday, May 09, 2017 at 0850.        Pelvic/Bimanual   Ext Genitalia No lesions, no swelling and no discharge observed on external genitalia.         Vagina Vagina pink and normal texture. No lesions or discharge observed in vagina.          Cervix Cervix is present. Cervix pink and of normal texture. No discharge observed.     Uterus Uterus is present and palpable. Uterus in normal position and normal size.        Adnexae Bilateral ovaries present and palpable. No tenderness on palpation.         Rectovaginal No rectal exam completed today since patient had no rectal complaints. No skin abnormalities observed on exam.    Smoking History: Patient has never smoked.  Patient Navigation: Patient education provided. Access to [facility]. Spanish interpreter provided.   Colorectal Cancer Screening: Per patient has never had a colonoscopy completed. No complaints today. FIT Test given to patient to complete and return to [facility].  Breast and Cervical Cancer Risk Assessment: Patient has no family history of breast cancer, known genetic mutations, or radiation treatment to the  chest before age 74. Patient has no history of cervical dysplasia, immunocompromised, or DES exposure in-utero.  Used Spanish interpreter Savannah Fischer [facility].

## 2017-05-10 ENCOUNTER — Encounter (HOSPITAL_COMMUNITY): Payer: Self-pay | Admitting: *Deleted

## 2017-05-13 LAB — CYTOLOGY - PAP
Diagnosis: NEGATIVE
HPV: NOT DETECTED

## 2017-05-14 ENCOUNTER — Ambulatory Visit
Admission: RE | Admit: 2017-05-14 | Discharge: 2017-05-14 | Disposition: A | Discharge status: Home / Self Care | Payer: No Typology Code available for payment source | Source: Ambulatory Visit | Attending: Obstetrics and Gynecology | Admitting: Obstetrics and Gynecology

## 2017-05-14 ENCOUNTER — Other Ambulatory Visit (HOSPITAL_COMMUNITY): Payer: Self-pay | Admitting: Obstetrics and Gynecology

## 2017-05-14 DIAGNOSIS — R928 Other abnormal and inconclusive findings on diagnostic imaging of breast: Secondary | ICD-10-CM

## 2017-06-17 ENCOUNTER — Encounter: Payer: Self-pay | Admitting: Internal Medicine

## 2017-06-20 ENCOUNTER — Encounter (HOSPITAL_COMMUNITY): Payer: Self-pay | Admitting: *Deleted

## 2017-06-20 NOTE — Progress Notes (Signed)
Letter mailed to patient with negative pap smear results. HPV was negative. Next pap smear due in five years. 

## 2017-07-30 ENCOUNTER — Encounter: Payer: Self-pay | Admitting: Internal Medicine

## 2020-04-11 ENCOUNTER — Other Ambulatory Visit: Payer: No Typology Code available for payment source
